# Patient Record
Sex: Male | Born: 2000 | Race: White | Hispanic: No | Marital: Single
Health system: Southern US, Community
[De-identification: ages and names within clinical notes are randomized; demographics above are authoritative.]

---
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
## 2021-08-31 IMAGING — US US RENAL
1 series · 14 of 25 positions shown · non-contrast
Comparison: 12/07/2013

CLINICAL DATA: Posterior urethral valves

EXAM:
RENAL / URINARY TRACT ULTRASOUND COMPLETE

[Series 1: us renal · 14 of 32 slices shown]
[im 1/32]
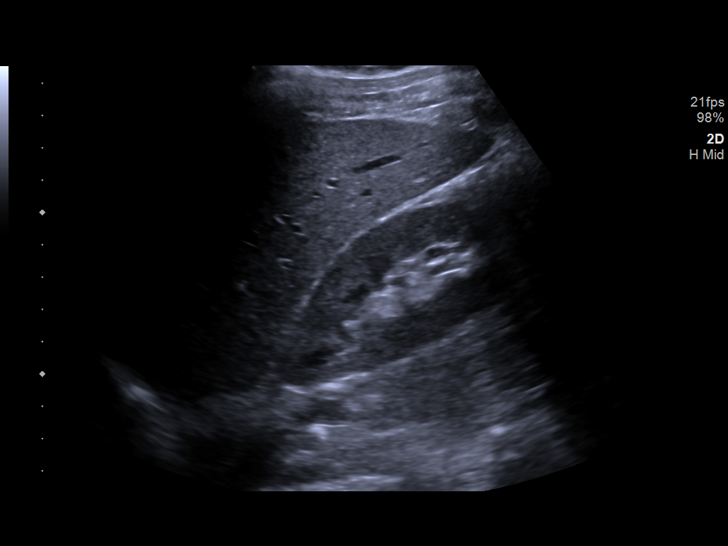
[im 3/32]
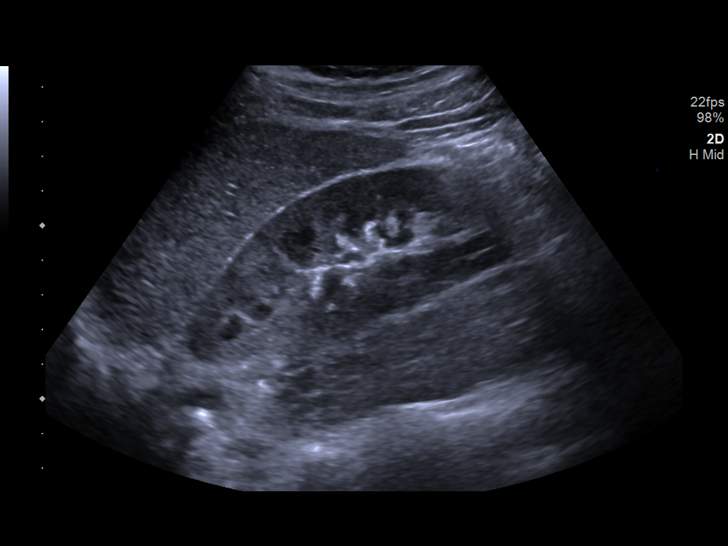
[im 6/32]
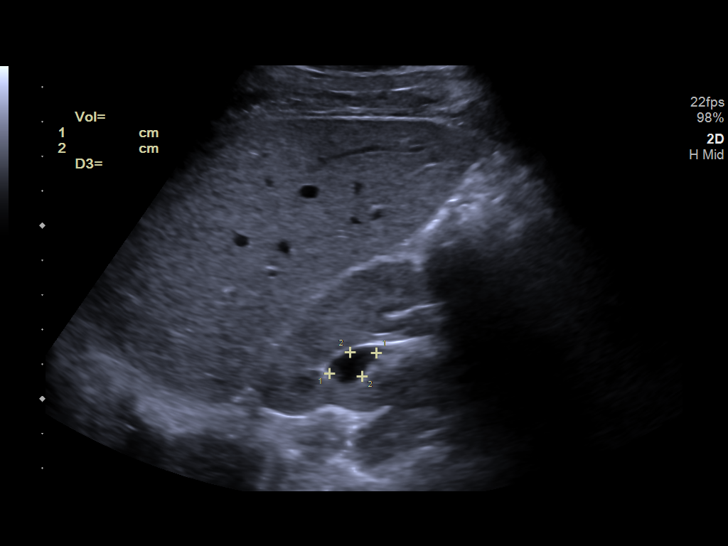
[im 8/32]
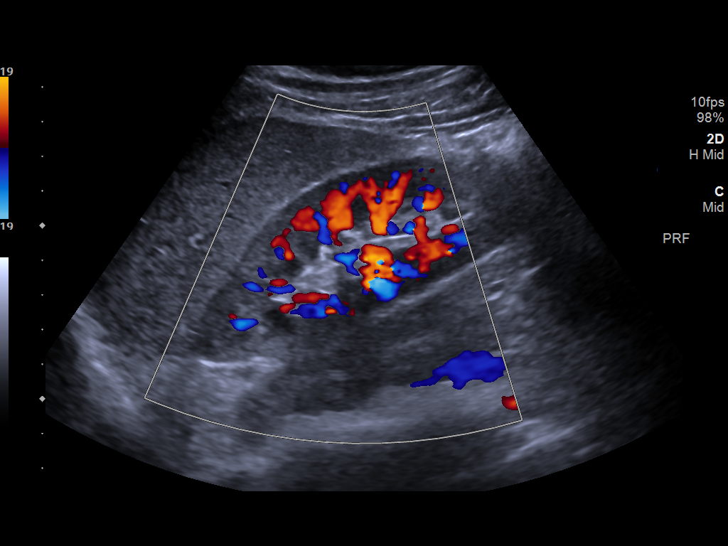
[im 11/32]
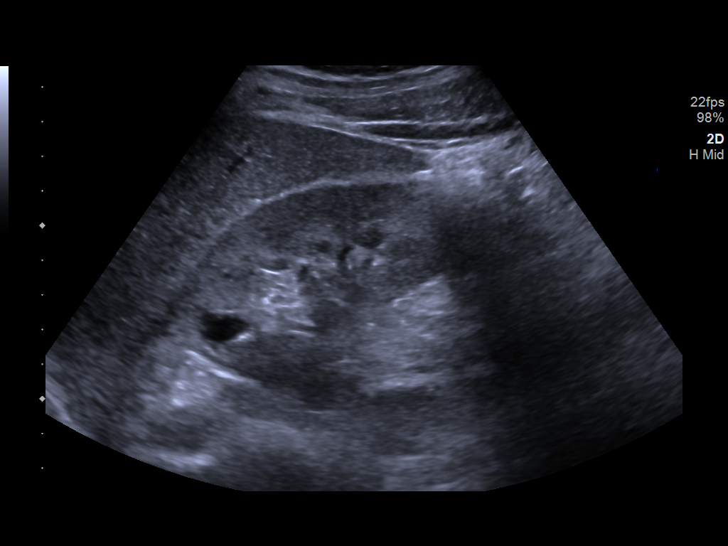
[im 12/32]
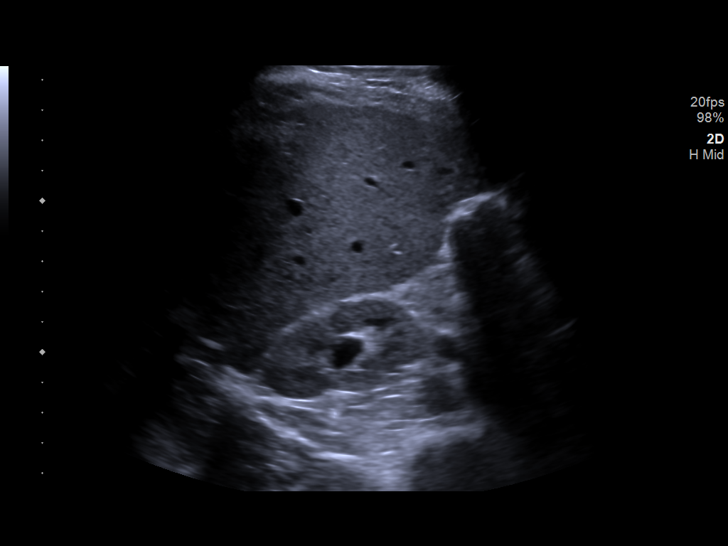
[im 15/32]
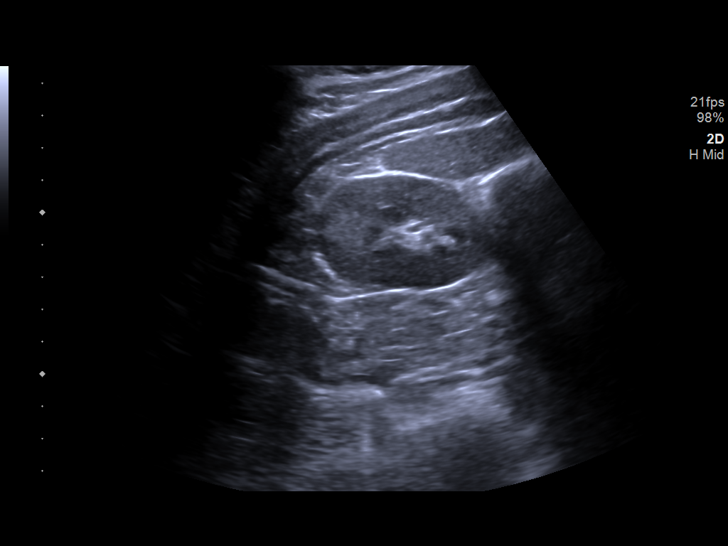
[im 17/32]
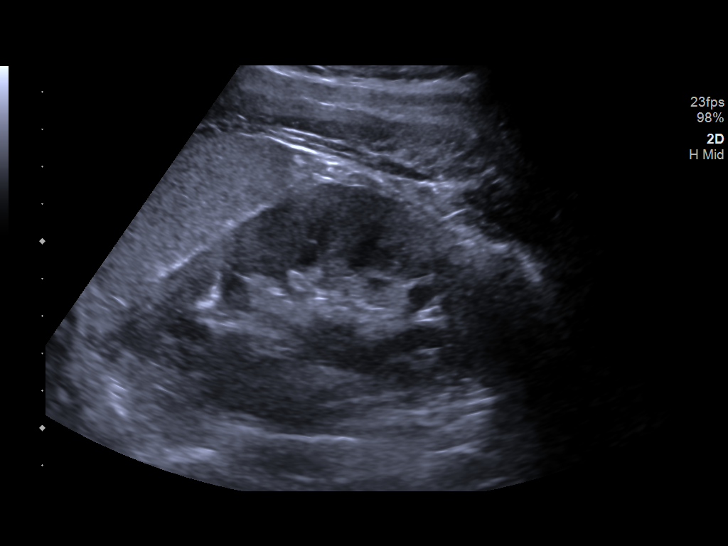
[im 20/32]
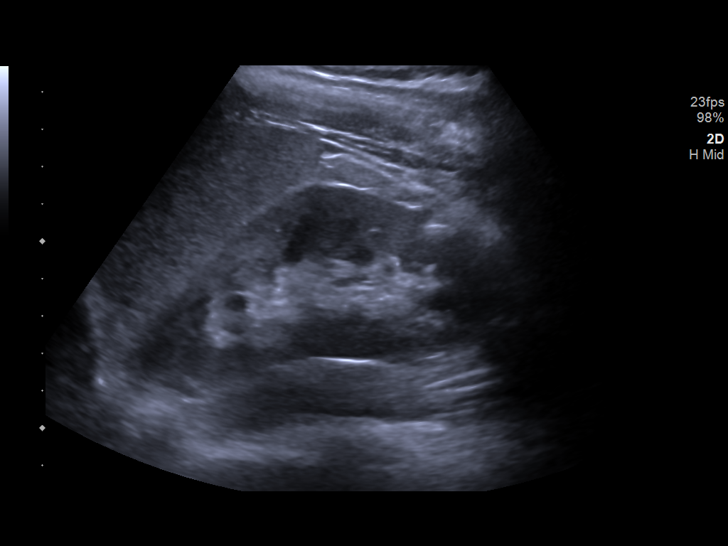
[im 21/32]
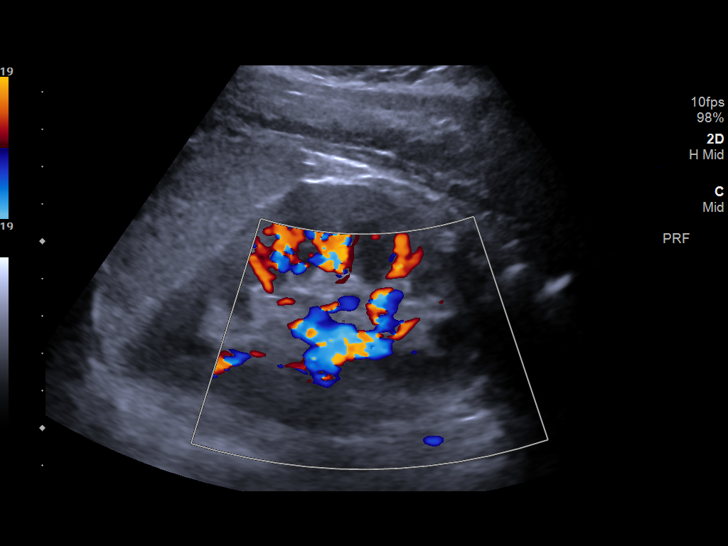
[im 24/32]
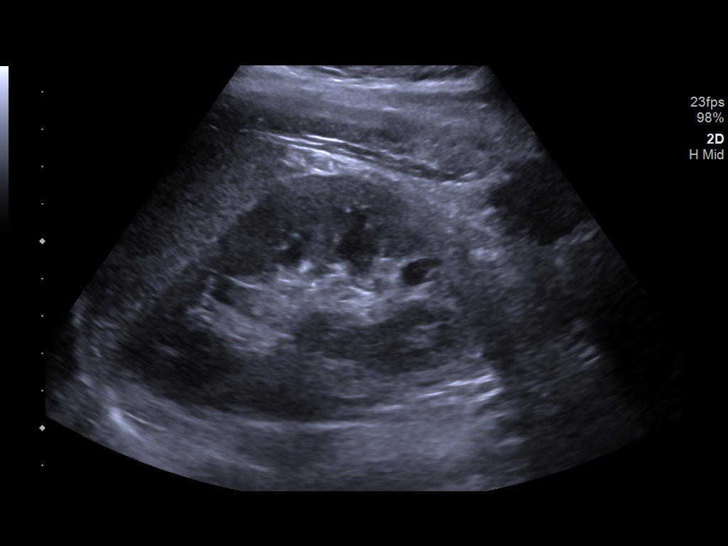
[im 26/32]
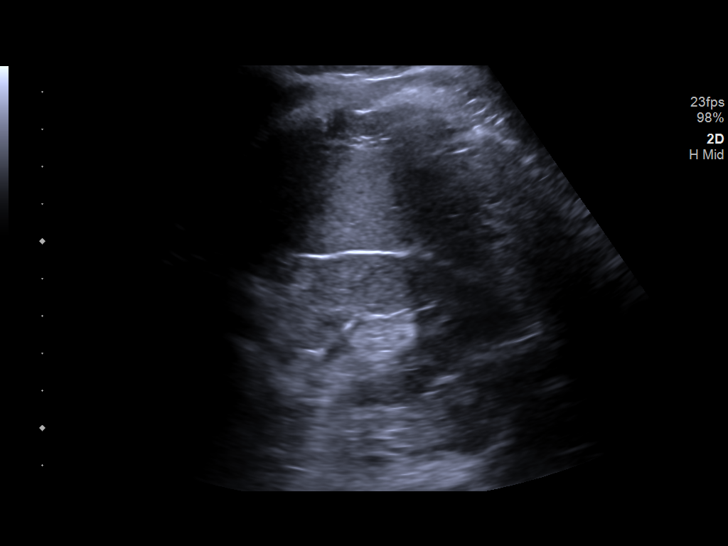
[im 29/32]
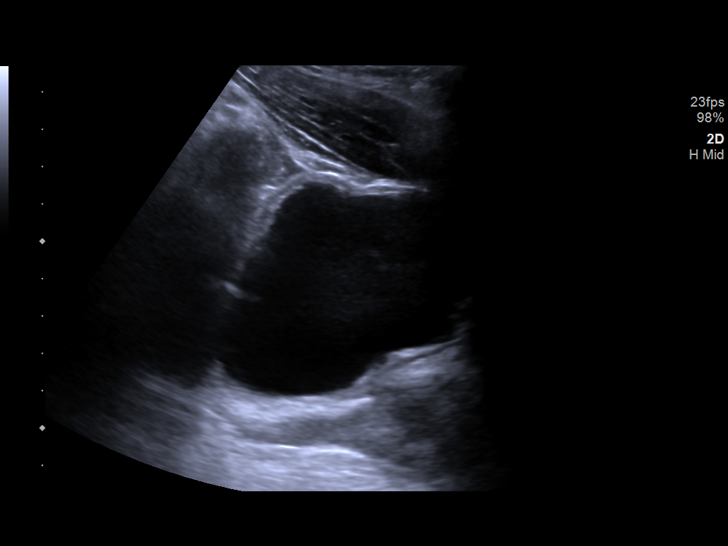
[im 32/32]
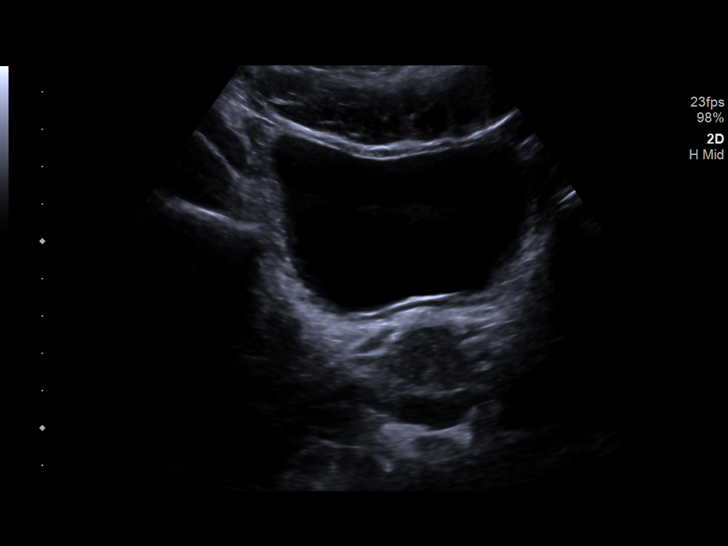

[14 of 25 positions shown; findings below may reference images not displayed]

FINDINGS: Right Kidney:

Renal measurements: 10.9 x 4.3 x 5.1 cm = volume: 124 mL. Cortical
echogenicity is normal. No hydronephrosis. Cyst in the upper pole
measuring 1.5 cm.

Left Kidney:

Renal measurements: 10.3 x 5.5 x 4.8 cm = volume: 143.4 mL.
Echogenicity within normal limits. No mass or hydronephrosis
visualized.

Bladder:

Appears normal for degree of bladder distention.

Other:

None.
IMPRESSION: 1. Resolution of previously noted hydronephrosis.
2. 1.5 cm cyst in the right kidney
